# Patient Record
Sex: Male | Born: 1990 | Race: Black or African American | Hispanic: No | Marital: Single | Smoking: Never smoker
Health system: Southern US, Community
[De-identification: ages and names within clinical notes are randomized; demographics above are authoritative.]

## PROBLEM LIST (undated history)

## (undated) DIAGNOSIS — R569 Unspecified convulsions: Secondary | ICD-10-CM

## (undated) HISTORY — PX: FRACTURE SURGERY: SHX138

---

## 2020-12-31 ENCOUNTER — Emergency Department: Payer: Self-pay

## 2020-12-31 ENCOUNTER — Emergency Department
Admission: EM | Admit: 2020-12-31 | Discharge: 2020-12-31 | Disposition: A | Discharge status: Home / Self Care | Payer: Self-pay | Attending: Emergency Medicine | Admitting: Emergency Medicine

## 2020-12-31 ENCOUNTER — Other Ambulatory Visit: Payer: Self-pay

## 2020-12-31 DIAGNOSIS — R519 Headache, unspecified: Secondary | ICD-10-CM | POA: Insufficient documentation

## 2020-12-31 DIAGNOSIS — R569 Unspecified convulsions: Secondary | ICD-10-CM | POA: Insufficient documentation

## 2020-12-31 DIAGNOSIS — G43909 Migraine, unspecified, not intractable, without status migrainosus: Secondary | ICD-10-CM

## 2020-12-31 LAB — CBC
HCT: 46.3 % (ref 39.0–52.0)
Hemoglobin: 14.7 g/dL (ref 13.0–17.0)
MCH: 27.9 pg (ref 26.0–34.0)
MCHC: 31.7 g/dL (ref 30.0–36.0)
MCV: 88 fL (ref 80.0–100.0)
Platelets: 136 10*3/uL — ABNORMAL LOW (ref 150–400)
RBC: 5.26 MIL/uL (ref 4.22–5.81)
RDW: 12.3 % (ref 11.5–15.5)
WBC: 11.1 10*3/uL — ABNORMAL HIGH (ref 4.0–10.5)
nRBC: 0 % (ref 0.0–0.2)

## 2020-12-31 LAB — BASIC METABOLIC PANEL
Anion gap: 7 (ref 5–15)
BUN: 9 mg/dL (ref 6–20)
CO2: 27 mmol/L (ref 22–32)
Calcium: 8.5 mg/dL — ABNORMAL LOW (ref 8.9–10.3)
Chloride: 104 mmol/L (ref 98–111)
Creatinine, Ser: 1 mg/dL (ref 0.61–1.24)
GFR, Estimated: >60 mL/min (ref >60)
Glucose, Bld: 93 mg/dL (ref 70–99)
Potassium: 3.9 mmol/L (ref 3.5–5.1)
Sodium: 138 mmol/L (ref 135–145)

## 2020-12-31 MED ORDER — BUTALBITAL-APAP-CAFFEINE 50-325-40 MG PO TABS: 1.0000 | ORAL_TABLET | Freq: Four times a day (QID) | ORAL | 0 refills | Status: AC | PRN

## 2020-12-31 MED ORDER — METOCLOPRAMIDE HCL 5 MG/ML IJ SOLN
10.0000 mg | Freq: Once | INTRAMUSCULAR | Status: AC
  Administered 2020-12-31: 10 mg via INTRAVENOUS
  Filled 2020-12-31: qty 2

## 2020-12-31 MED ORDER — KETOROLAC TROMETHAMINE 30 MG/ML IJ SOLN
30.0000 mg | Freq: Once | INTRAMUSCULAR | Status: AC
  Administered 2020-12-31: 30 mg via INTRAVENOUS
  Filled 2020-12-31: qty 1

## 2020-12-31 MED ORDER — DIPHENHYDRAMINE HCL 50 MG/ML IJ SOLN
50.0000 mg | Freq: Once | INTRAMUSCULAR | Status: AC
  Administered 2020-12-31: 50 mg via INTRAVENOUS
  Filled 2020-12-31: qty 1

## 2020-12-31 NOTE — ED Triage Notes (Signed)
Pt comes with c/o headache and vomiting. Pt states generalized aches and pain.s

## 2020-12-31 NOTE — ED Provider Notes (Signed)
[facility] Emergency Department Provider Note  Time seen: 1:19 PM  I have reviewed the triage vital signs and the nursing notes.   HISTORY  Chief Complaint Headache   HPI Scott Moon is a 30 y.o. male with a past medical history of seizures per patient presents to the emergency department for headache and seizure.  According to the patient in 2019 he developed a seizure disorder.  States he had work-ups performed however including EEGs that were all normal.  Patient was put on Dilantin but stopped this medication approximately 1 year ago.  Patient states a few days ago he thinks he may have had another seizure but he is not sure remembers feeling weird while on the couch and then waking up.  Patient states over the past few days he has had an intermittent headache and now it has been constant over the past 24 hours.  Denies any nausea or vomiting.  No weakness or numbness.  No fever.  Patient has not followed up with a neurologist in several years.   History reviewed. No pertinent past medical history.  There are no problems to display for this patient.   History reviewed. No pertinent surgical history.  Prior to Admission medications   Not on File    Not on File  No family history on file.  Social History    Review of Systems Constitutional: Negative for fever. Cardiovascular: Negative for chest pain. Respiratory: Negative for shortness of breath. Gastrointestinal: Negative for abdominal pain, vomiting  Musculoskeletal: Negative for musculoskeletal complaints Neurological: Moderate headache All other ROS negative  ____________________________________________   PHYSICAL EXAM:  VITAL SIGNS: ED Triage Vitals  Enc Vitals Group     BP 12/31/20 1205 126/90     Pulse Rate 12/31/20 1205 (!) 58     Resp 12/31/20 1205 17     Temp 12/31/20 1205 98 F (36.7 C)     Temp src --      SpO2 12/31/20 1205 100 %     Weight 12/31/20 1204 150 lb (68  kg)     Height 12/31/20 1204 6' (1.829 m)     Head Circumference --      Peak Flow --      Pain Score 12/31/20 1204 10     Pain Loc --      Pain Edu? --      Excl. in GC? --     Constitutional: Alert and oriented. Well appearing and in no distress. Eyes: Normal exam, no significant photophobia. ENT      Head: Normocephalic and atraumatic.      Mouth/Throat: Mucous membranes are moist. Cardiovascular: Normal rate, regular rhythm. Respiratory: Normal respiratory effort without tachypnea nor retractions. Breath sounds are clear  Gastrointestinal: Soft and nontender. No distention.   Musculoskeletal: Nontender with normal range of motion in all extremities. Neurologic:  Normal speech and language. No gross focal neurologic deficits.  Equal grip strength.  No pronator drift.  Cranial nerves intact.  Skin:  Skin is warm, dry and intact.  Psychiatric: Mood and affect are normal.   ____________________________________________   RADIOLOGY  CT scan is negative for acute abnormality  ____________________________________________   INITIAL IMPRESSION / ASSESSMENT AND PLAN / ED COURSE  Pertinent labs & imaging results that were available during my care of the patient were reviewed by me and considered in my medical decision making (see chart for details).   Patient presents emergency department for headache intermittent over the past week but  ongoing over the past 24 hours.  Also states he was diagnosed with seizures several years ago stopped taking his Dilantin greater than 1 year ago and thinks the other day he may have had a seizure but he is not sure.  Patient states when he had his seizure work-up everything was normal he was told including his EEG.  Currently the patient appears well, normal neurological exam.  Reassuring physical exam.  Given the patient's reported headache however we will dose Toradol Reglan Benadryl.  Given the patient's seizures he states he has never had brain  imaging CT or MRI will obtain a CT scan as a precaution.  The patient's work-up is within normal limits anticipate likely referral to neurology for further evaluation and consideration of antiepileptics.  Discussed with the patient not driving.  CT scans negative for acute abnormality.  Lab work is overall reassuring.  We will refer to neurology.  I did discuss not driving or swimming until he has been cleared by neurology.  We will prescribe Fioricet if needed for headaches in the future.  Patient states significant reduction in headache currently.  Scott Moon was evaluated in Emergency Department on 12/31/2020 for the symptoms described in the history of present illness. He was evaluated in the context of the global COVID-19 pandemic, which necessitated consideration that the patient might be at risk for infection with the SARS-CoV-2 virus that causes COVID-19. Institutional protocols and algorithms that pertain to the evaluation of patients at risk for COVID-19 are in a state of rapid change based on information released by regulatory bodies including the CDC and federal and state organizations. These policies and algorithms were followed during the patient's care in the ED.  ____________________________________________   FINAL CLINICAL IMPRESSION(S) / ED DIAGNOSES  Headache Seizure-like activity   Minna Antis, MD 12/31/20 1534

## 2021-01-21 ENCOUNTER — Other Ambulatory Visit: Payer: Self-pay

## 2021-01-21 ENCOUNTER — Emergency Department: Payer: Self-pay

## 2021-01-21 ENCOUNTER — Emergency Department
Admission: EM | Admit: 2021-01-21 | Discharge: 2021-01-21 | Disposition: A | Discharge status: Home / Self Care | Payer: Self-pay | Attending: Emergency Medicine | Admitting: Emergency Medicine

## 2021-01-21 DIAGNOSIS — Y92410 Unspecified street and highway as the place of occurrence of the external cause: Secondary | ICD-10-CM | POA: Insufficient documentation

## 2021-01-21 DIAGNOSIS — G40909 Epilepsy, unspecified, not intractable, without status epilepticus: Secondary | ICD-10-CM | POA: Insufficient documentation

## 2021-01-21 DIAGNOSIS — R569 Unspecified convulsions: Secondary | ICD-10-CM

## 2021-01-21 HISTORY — DX: Unspecified convulsions: R56.9

## 2021-01-21 LAB — CBC
HCT: 38.9 % — ABNORMAL LOW (ref 39.0–52.0)
Hemoglobin: 12.5 g/dL — ABNORMAL LOW (ref 13.0–17.0)
MCH: 28.3 pg (ref 26.0–34.0)
MCHC: 32.1 g/dL (ref 30.0–36.0)
MCV: 88.2 fL (ref 80.0–100.0)
Platelets: 159 10*3/uL (ref 150–400)
RBC: 4.41 MIL/uL (ref 4.22–5.81)
RDW: 12.3 % (ref 11.5–15.5)
WBC: 8.1 10*3/uL (ref 4.0–10.5)
nRBC: 0 % (ref 0.0–0.2)

## 2021-01-21 LAB — BASIC METABOLIC PANEL
Anion gap: 7 (ref 5–15)
BUN: 11 mg/dL (ref 6–20)
CO2: 26 mmol/L (ref 22–32)
Calcium: 8.8 mg/dL — ABNORMAL LOW (ref 8.9–10.3)
Chloride: 106 mmol/L (ref 98–111)
Creatinine, Ser: 0.84 mg/dL (ref 0.61–1.24)
GFR, Estimated: >60 mL/min (ref >60)
Glucose, Bld: 138 mg/dL — ABNORMAL HIGH (ref 70–99)
Potassium: 3.7 mmol/L (ref 3.5–5.1)
Sodium: 139 mmol/L (ref 135–145)

## 2021-01-21 LAB — URINE DRUG SCREEN, QUALITATIVE (ARMC ONLY)
Amphetamines, Ur Screen: NOT DETECTED
Barbiturates, Ur Screen: NOT DETECTED
Benzodiazepine, Ur Scrn: NOT DETECTED
Cannabinoid 50 Ng, Ur ~~LOC~~: POSITIVE — AB
Cocaine Metabolite,Ur ~~LOC~~: NOT DETECTED
MDMA (Ecstasy)Ur Screen: NOT DETECTED
Methadone Scn, Ur: NOT DETECTED
Opiate, Ur Screen: NOT DETECTED
Phencyclidine (PCP) Ur S: NOT DETECTED
Tricyclic, Ur Screen: NOT DETECTED

## 2021-01-21 LAB — URINALYSIS, COMPLETE (UACMP) WITH MICROSCOPIC
Bacteria, UA: NONE SEEN
Bilirubin Urine: NEGATIVE
Glucose, UA: NEGATIVE mg/dL
Hgb urine dipstick: NEGATIVE
Ketones, ur: NEGATIVE mg/dL
Nitrite: NEGATIVE
Protein, ur: NEGATIVE mg/dL
Specific Gravity, Urine: 1.015 (ref 1.005–1.030)
pH: 8 (ref 5.0–8.0)

## 2021-01-21 LAB — CBG MONITORING, ED: Glucose-Capillary: 81 mg/dL (ref 70–99)

## 2021-01-21 LAB — PHENYTOIN LEVEL, TOTAL: Phenytoin Lvl: <2.5 ug/mL — ABNORMAL LOW (ref 10.0–20.0)

## 2021-01-21 MED ORDER — PHENYTOIN SODIUM EXTENDED 300 MG PO CAPS: 300.0000 mg | ORAL_CAPSULE | Freq: Every day | ORAL | 3 refills | Status: AC

## 2021-01-21 MED ORDER — PHENYTOIN SODIUM EXTENDED 100 MG PO CAPS
300.0000 mg | ORAL_CAPSULE | Freq: Once | ORAL | Status: AC
  Administered 2021-01-21: 300 mg via ORAL
  Filled 2021-01-21: qty 3
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
## 2021-01-21 NOTE — ED Notes (Signed)
Pt ambulatory independently with a steady gait to restroom and back to room.

## 2021-01-21 NOTE — Discharge Instructions (Addendum)
Follow-up with neurology.  Please call for appointment.  Phone number is attached to papers.  Take your Dilantin daily.  If you continue to have additional seizures she may return the emergency department.  No driving until you have been evaluated by neurology.
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
## 2021-01-21 NOTE — ED Notes (Signed)
Patient transported to CT 

## 2021-01-22 LAB — PHENYTOIN LEVEL, FREE AND TOTAL
Phenytoin, Free: NOT DETECTED ug/mL (ref 1.0–2.0)
Phenytoin, Total: <0.8 ug/mL — ABNORMAL LOW (ref 10.0–20.0)

## 2021-08-29 IMAGING — CT CT HEAD W/O CM
3 series · 16 of 47 positions shown, 19 images · non-contrast
Comparison: None.

CLINICAL DATA: Seizure, abnormal neuro exam.

EXAM:
CT HEAD WITHOUT CONTRAST
TECHNIQUE: Contiguous axial images were obtained from the base of the skull
through the vertex without intravenous contrast.

[Series 2: head wo · axial · 0.42mm/px · z∈[-180,-55]mm · 10 of 30 slices shown, 13 images]
[im 3/30  brain]
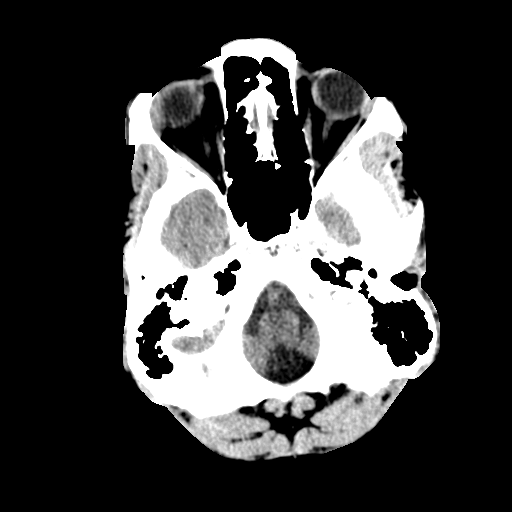
[im 3/30  bone]
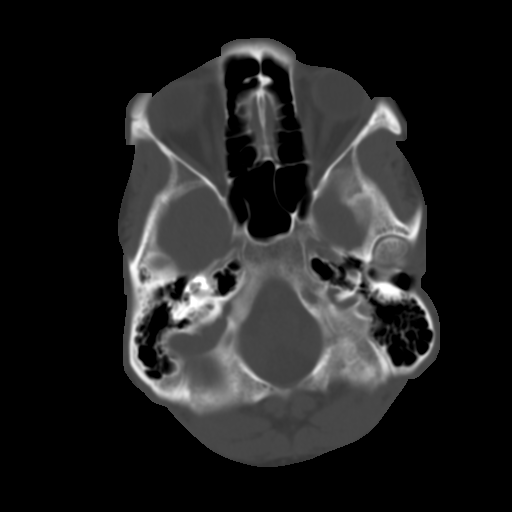
[im 6/30  brain]
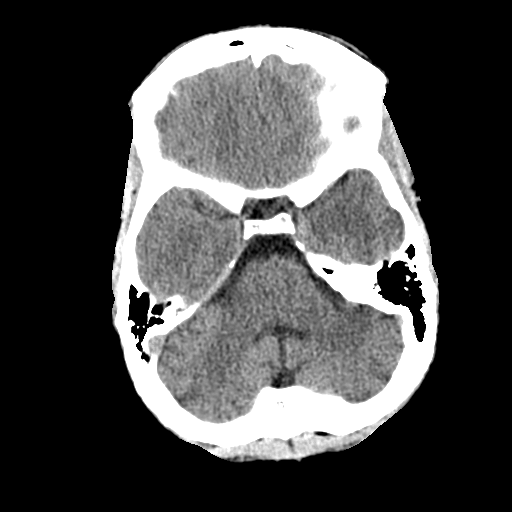
[im 9/30  brain]
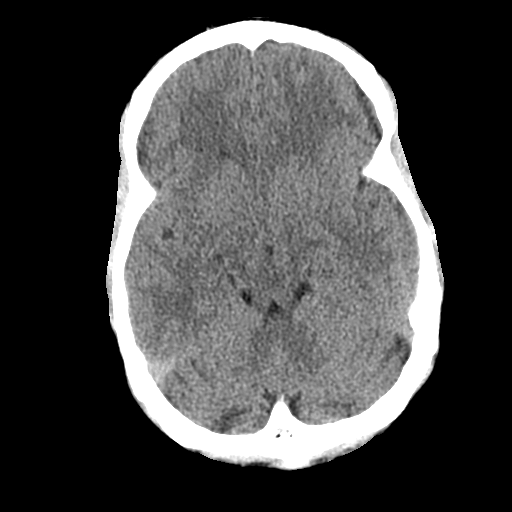
[im 11/30  brain]
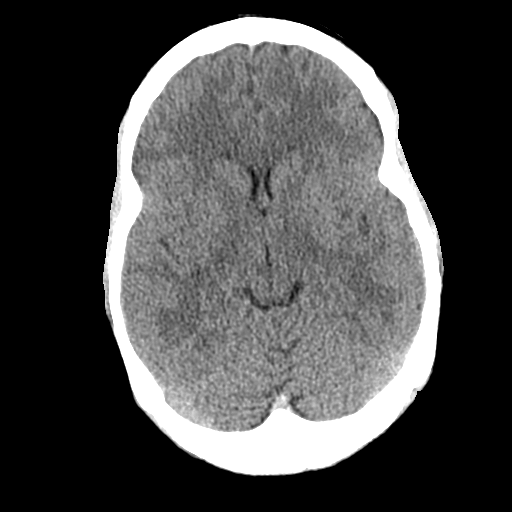
[im 14/30  brain]
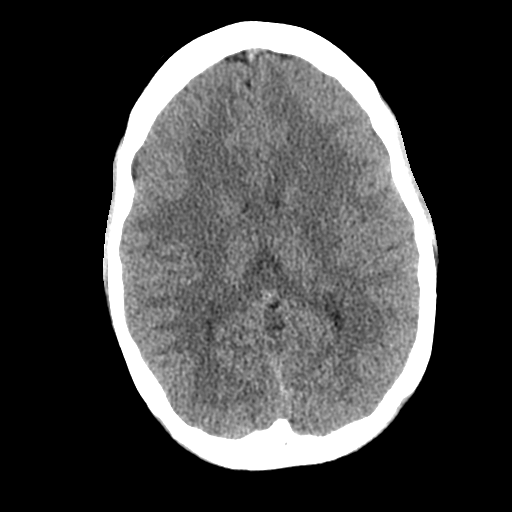
[im 14/30  bone]
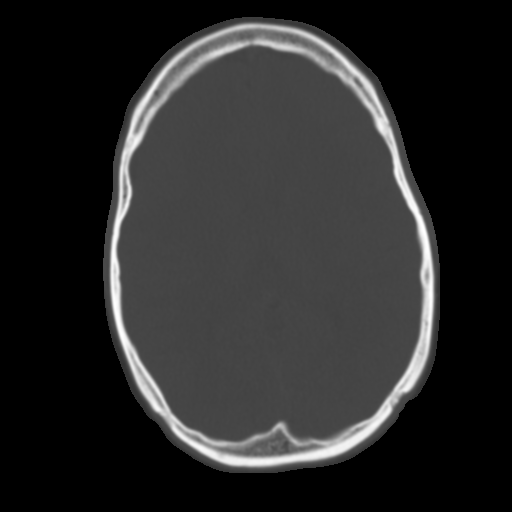
[im 17/30  brain]
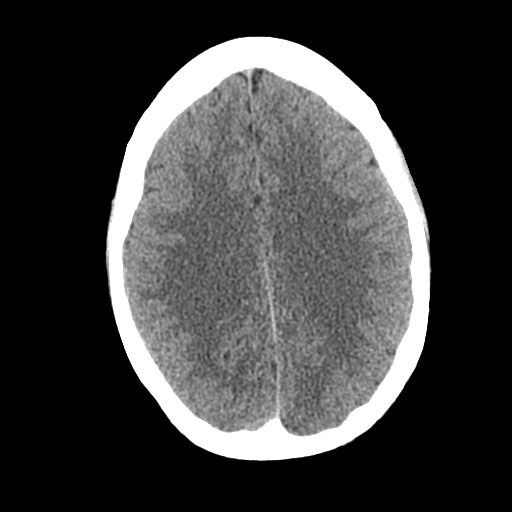
[im 20/30  brain]
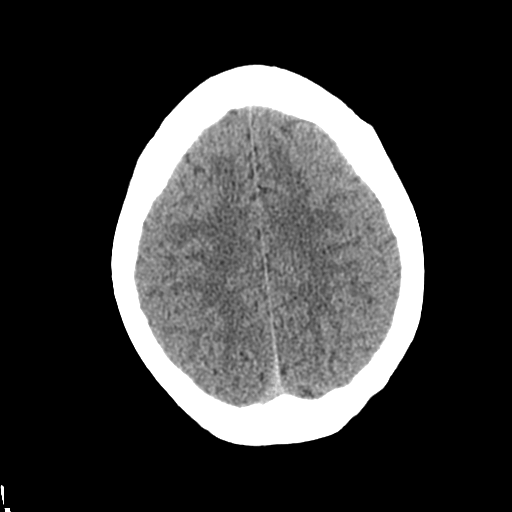
[im 23/30  brain]
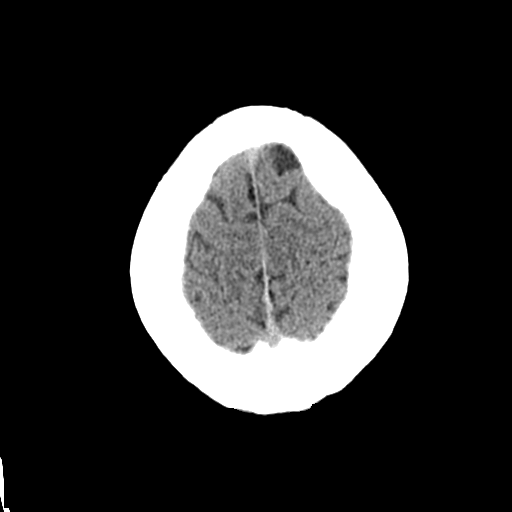
[im 25/30  brain]
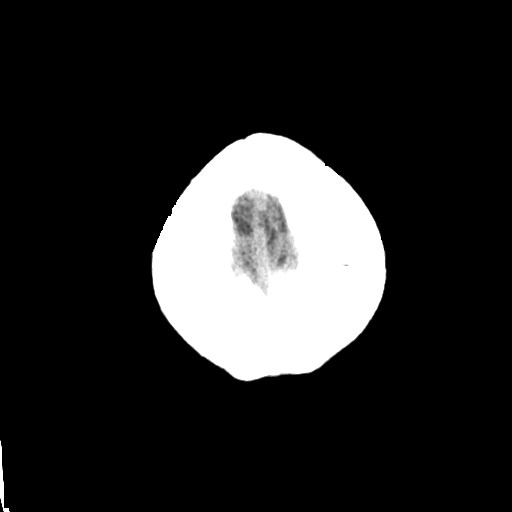
[im 25/30  bone]
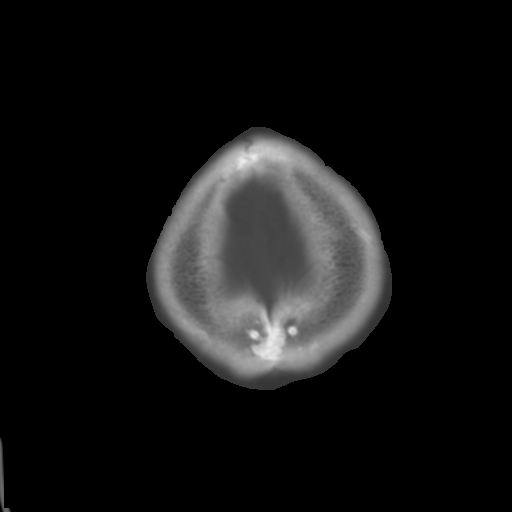
[im 28/30  brain]
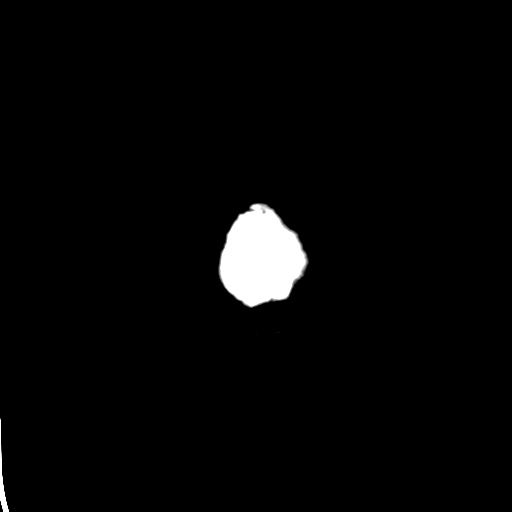

[Series 4: coronal soft tissue · coronal · 0.30mm/px · 3 of 69 slices shown]
[im 23/69  brain]
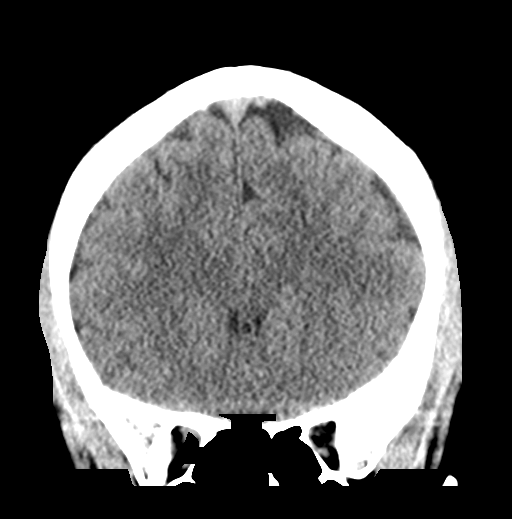
[im 31/69  brain]
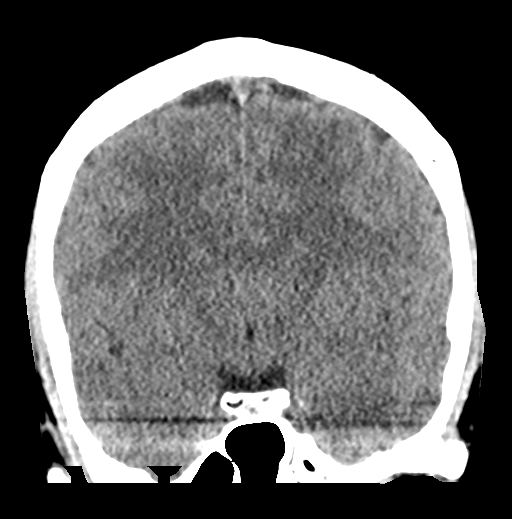
[im 38/69  brain]
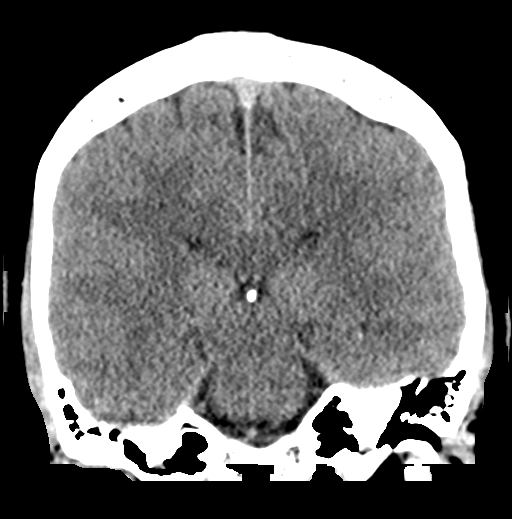

[Series 5: sagittal soft tissue · sagittal · 0.31mm/px · 3 of 53 slices shown]
[im 18/53  brain]
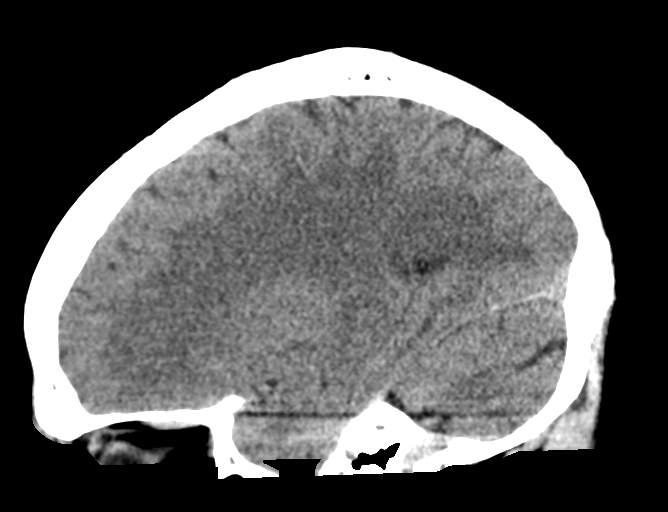
[im 27/53  brain]
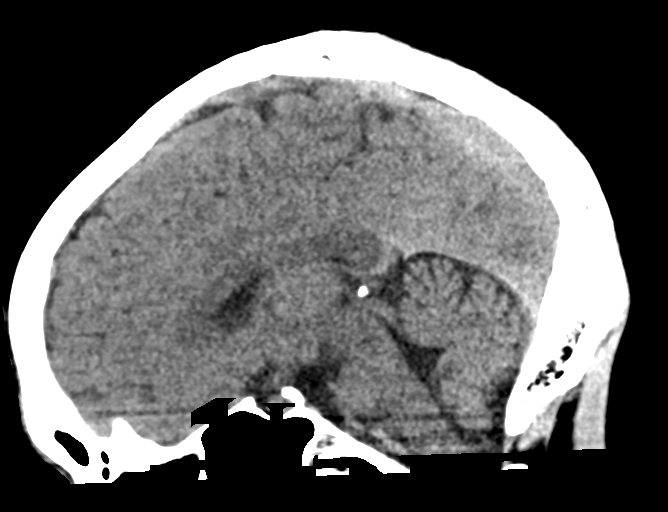
[im 35/53  brain]
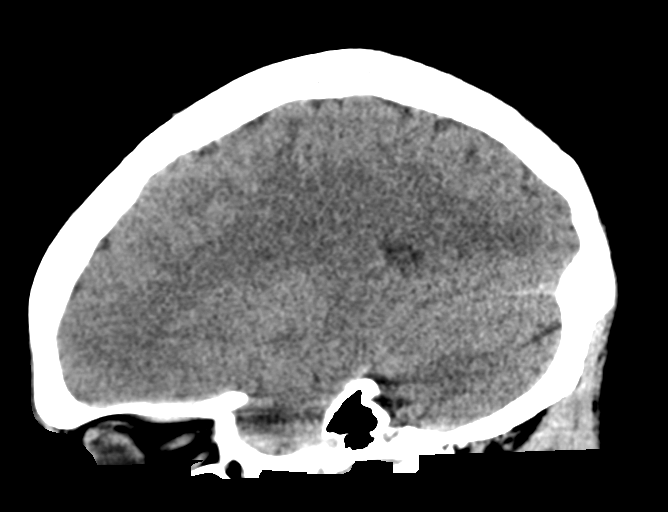

[16 of 47 positions shown; findings below may reference images not displayed]

FINDINGS: Brain: No evidence of acute large vascular territory infarction,
hemorrhage, hydrocephalus, extra-axial collection or mass
lesion/mass effect.

Vascular: No hyperdense vessel identified.

Skull: No acute fracture.

Sinuses/Orbits: Clear visualized sinuses. Unremarkable visualized
orbits.

Other: No visible mastoid effusions.
IMPRESSION: No evidence of acute intracranial abnormality. If the patient's
seizures continue recommend epilepsy protocol MRI to provide more
sensitive evaluation for an anatomic epileptogenic abnormality.

## 2022-06-18 ENCOUNTER — Other Ambulatory Visit: Payer: Self-pay

## 2022-06-18 ENCOUNTER — Encounter: Payer: Self-pay | Admitting: Emergency Medicine

## 2022-06-18 DIAGNOSIS — R42 Dizziness and giddiness: Secondary | ICD-10-CM | POA: Insufficient documentation

## 2022-06-18 LAB — BASIC METABOLIC PANEL
Anion gap: 6 (ref 5–15)
BUN: 10 mg/dL (ref 6–20)
CO2: 28 mmol/L (ref 22–32)
Calcium: 9.3 mg/dL (ref 8.9–10.3)
Chloride: 103 mmol/L (ref 98–111)
Creatinine, Ser: 0.79 mg/dL (ref 0.61–1.24)
GFR, Estimated: >60 mL/min (ref >60)
Glucose, Bld: 108 mg/dL — ABNORMAL HIGH (ref 70–99)
Potassium: 3.6 mmol/L (ref 3.5–5.1)
Sodium: 137 mmol/L (ref 135–145)

## 2022-06-18 LAB — CBC
HCT: 42.6 % (ref 39.0–52.0)
Hemoglobin: 13.4 g/dL (ref 13.0–17.0)
MCH: 27.4 pg (ref 26.0–34.0)
MCHC: 31.5 g/dL (ref 30.0–36.0)
MCV: 87.1 fL (ref 80.0–100.0)
Platelets: 155 10*3/uL (ref 150–400)
RBC: 4.89 MIL/uL (ref 4.22–5.81)
RDW: 11.9 % (ref 11.5–15.5)
WBC: 8.5 10*3/uL (ref 4.0–10.5)
nRBC: 0 % (ref 0.0–0.2)

## 2022-06-18 LAB — TROPONIN I (HIGH SENSITIVITY): Troponin I (High Sensitivity): 4 ng/L (ref <18)

## 2022-06-18 LAB — PHENYTOIN LEVEL, TOTAL: Phenytoin Lvl: 3.2 ug/mL — ABNORMAL LOW (ref 10.0–20.0)

## 2022-06-18 NOTE — ED Triage Notes (Signed)
Pt arrived via POV reports dizziness since Tuesday while at work, pt states the dizziness is worse with standing.  Pt denies any pain at this time. Pt states he take dilantin reports he recently got a new RX and has missed some doses.   Denies any recent seizures.

## 2022-06-19 ENCOUNTER — Emergency Department
Admission: EM | Admit: 2022-06-19 | Discharge: 2022-06-19 | Disposition: A | Discharge status: Home / Self Care | Payer: Self-pay | Attending: Emergency Medicine | Admitting: Emergency Medicine

## 2022-06-19 ENCOUNTER — Emergency Department: Payer: Self-pay

## 2022-06-19 DIAGNOSIS — R42 Dizziness and giddiness: Secondary | ICD-10-CM

## 2022-06-19 MED ORDER — SODIUM CHLORIDE 0.9 % IV BOLUS
1000.0000 mL | Freq: Once | INTRAVENOUS | Status: AC
  Administered 2022-06-19: 1000 mL via INTRAVENOUS

## 2022-06-19 MED ORDER — PHENYTOIN SODIUM EXTENDED 100 MG PO CAPS
300.0000 mg | ORAL_CAPSULE | Freq: Once | ORAL | Status: AC
  Administered 2022-06-19: 300 mg via ORAL
  Filled 2022-06-19: qty 3

## 2022-06-19 NOTE — Discharge Instructions (Signed)
Drink plenty of fluids daily.  Return to the ER for worsening symptoms, persistent vomiting, lethargy or other concerns. 

## 2022-06-19 NOTE — ED Provider Notes (Signed)
[facility] Provider Note    Event Date/Time   First MD Initiated Contact with Patient 06/19/22 0014     (approximate)   History   Dizziness   HPI  Scott Moon is a 31 y.o. male who presents to the ED from home with a chief complaint of dizziness.  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  Reports sensation of room spinning, worsened with standing.  Symptoms x3 days which began while at work.  Denies vision changes, headache, neck pain, chest pain, shortness of breath, abdominal pain, nausea or vomiting.  Denies sinus congestion/pressure.  History of seizures and reports last took Dilantin yesterday, none today.     Past Medical History   Past Medical History:  Diagnosis Date   Seizures (HCC)      Active Problem List  There are no problems to display for this patient.    Past Surgical History  History reviewed. No pertinent surgical history.   Home Medications   Prior to Admission medications   Medication Sig Start Date End Date Taking? Authorizing Provider  phenytoin (DILANTIN) 300 MG ER capsule Take 1 capsule (300 mg total) by mouth daily. 01/21/21   Faythe Ghee, PA-C     Allergies  Patient has no known allergies.   Family History  No family history on file.   Physical Exam  Triage Vital Signs: ED Triage Vitals [06/18/22 2034]  Enc Vitals Group     BP (!) 133/91     Pulse Rate 62     Resp 16     Temp 98.1 F (36.7 C)     Temp Source Oral     SpO2 99 %     Weight 147 lb (66.7 kg)     Height 6' (1.829 m)     Head Circumference      Peak Flow      Pain Score 0     Pain Loc      Pain Edu?      Excl. in GC?     Updated Vital Signs: BP (!) 133/92   Pulse 65   Temp 98 F (36.7 C) (Oral)   Resp 16   Ht 6' (1.829 m)   Wt 66.7 kg   SpO2 99%   BMI 19.94 kg/m    General: Awake, no distress.  CV:  Good peripheral perfusion.  Resp:  Normal effort.  Abd:  No distention.  Other:     ED  Results / Procedures / Treatments  Labs (all labs ordered are listed, but only abnormal results are displayed) Labs Reviewed  BASIC METABOLIC PANEL - Abnormal; Notable for the following components:      Result Value   Glucose, Bld 108 (*)    All other components within normal limits  PHENYTOIN LEVEL, TOTAL - Abnormal; Notable for the following components:   Phenytoin Lvl 3.2 (*)    All other components within normal limits  CBC  URINALYSIS, ROUTINE W REFLEX MICROSCOPIC  TROPONIN I (HIGH SENSITIVITY)     EKG  ED ECG REPORT I, Adebayo Ensminger J, the attending physician, personally viewed and interpreted this ECG.   Date: 06/19/2022  EKG Time: 2038  Rate: 50  Rhythm: sinus bradycardia  Axis: Normal  Intervals:none  ST&T Change: Nonspecific    RADIOLOGY I have independently visualized and interpreted patient's CT head as well as noted the radiology interpretation:  CT head: No ICH  Official radiology report(s): CT Head Wo Contrast  Result Date: 06/19/2022 CLINICAL DATA:  Dizziness. EXAM: CT HEAD WITHOUT CONTRAST TECHNIQUE: Contiguous axial images were obtained from the base of the skull through the vertex without intravenous contrast. RADIATION DOSE REDUCTION: This exam was performed according to the departmental dose-optimization program which includes automated exposure control, adjustment of the mA and/or kV according to patient size and/or use of iterative reconstruction technique. COMPARISON:  Jan 21, 2021 FINDINGS: Brain: No evidence of acute infarction, hemorrhage, hydrocephalus, extra-axial collection or mass lesion/mass effect. Vascular: No hyperdense vessel or unexpected calcification. Skull: Normal. Negative for fracture or focal lesion. Sinuses/Orbits: No acute finding. Other: None. IMPRESSION: No acute intracranial pathology. Electronically Signed   By: Virgina Norfolk M.D.   On: 06/19/2022 00:49     PROCEDURES:  Critical Care performed:  No  Procedures   MEDICATIONS ORDERED IN ED: Medications  sodium chloride 0.9 % bolus 1,000 mL (0 mLs Intravenous Stopped 06/19/22 0154)  phenytoin (DILANTIN) ER capsule 300 mg (300 mg Oral Given 06/19/22 0052)     IMPRESSION / MDM / ASSESSMENT AND PLAN / ED COURSE  I reviewed the triage vital signs and the nursing notes.                             31 year old male presenting with dizziness.  Differential diagnosis includes but is not limited to orthostasis, dehydration, ACS, metabolic, infectious etiologies, etc.  I have personally reviewed patient's records and see a last ED visit in May 2022 for seizure.  Patient's presentation is most consistent with acute presentation with potential threat to life or bodily function.  Laboratory results demonstrate normal WBC 8.5, normal electrolytes and troponin, subtherapeutic phenytoin level.  Will obtain CT head, orthostatic vital signs, initiate IV fluid resuscitation, load Dilantin orally and reassess.  Clinical Course as of 06/19/22 3664  Ludwig Clarks Jun 19, 2022  0134 Patient orthostatic by heart rate [JS]  0211 Patient feeling better after IV fluids.  Updated patient and his godmother of all results including the unremarkable CT head.  No focal neurological deficits on examination.  Neck remains supple without meningismus.  Feel patient is safe for discharge home with PCP follow-up.  Strict return precautions given.  Both verbalized understanding agree with plan of care. [JS]    Clinical Course User Index [JS] Paulette Blanch, MD     FINAL CLINICAL IMPRESSION(S) / ED DIAGNOSES   Final diagnoses:  Dizziness  Orthostatic dizziness     Rx / DC Orders   ED Discharge Orders     None        Note:  This document was prepared using Dragon voice recognition software and may include unintentional dictation errors.   Paulette Blanch, MD 06/19/22 [phone]

## 2022-06-23 ENCOUNTER — Ambulatory Visit
Admission: RE | Admit: 2022-06-23 | Discharge: 2022-06-23 | Disposition: A | Discharge status: Home / Self Care | Payer: 59 | Source: Ambulatory Visit | Attending: Family Medicine | Admitting: Family Medicine

## 2022-06-23 VITALS — BP 137/81 | HR 52 | Temp 98.5°F | Resp 16 | Ht 72.0 in | Wt 147.0 lb

## 2022-06-23 DIAGNOSIS — Z202 Contact with and (suspected) exposure to infections with a predominantly sexual mode of transmission: Secondary | ICD-10-CM | POA: Diagnosis not present

## 2022-06-23 DIAGNOSIS — Z113 Encounter for screening for infections with a predominantly sexual mode of transmission: Secondary | ICD-10-CM | POA: Diagnosis not present

## 2022-06-23 MED ORDER — METRONIDAZOLE 500 MG PO TABS: 2000.0000 mg | ORAL_TABLET | Freq: Once | ORAL | 0 refills | Status: AC

## 2022-06-23 NOTE — Discharge Instructions (Signed)
Stop by the pharmacy to pick up your prescriptions.  Follow up with your primary care provider as needed.  

## 2022-06-23 NOTE — ED Provider Notes (Signed)
[facility]    CSN: [id number] Arrival date & time: 06/23/22  1020      History   Chief Complaint Chief Complaint  Patient presents with   Exposure to STD    HPI  HPI  Slayden Mennenga is a 31 y.o. male.   Pt here for STD screening.  States that his current girlfriend told him that she has trichomonas after being checked at her doctor's office.   Reports no  symptoms.    Ramirez does not use condoms regularly.   - Penile discharge no -Testicular pain no  - Fever: no - Abdominal pain no - Rash: no - Sore throat: no   - Arthralgias: no - Nausea: no - Vomiting: no - Dysuria: no - Back Pain: no  - Headache: no       Past Medical History:  Diagnosis Date   Seizures (HCC)     There are no problems to display for this patient.   Past Surgical History:  Procedure Laterality Date   FRACTURE SURGERY Right        Home Medications    Prior to Admission medications   Medication Sig Start Date End Date Taking? Authorizing Provider  metroNIDAZOLE (FLAGYL) 500 MG tablet Take 4 tablets (2,000 mg total) by mouth once for 1 dose. 06/23/22 06/23/22 Yes Lonza Shimabukuro, DO  phenytoin (DILANTIN) 300 MG ER capsule Take 1 capsule (300 mg total) by mouth daily. 01/21/21  Yes Faythe Ghee, PA-C    Family History No family history on file.  Social History Social History   Tobacco Use   Smoking status: Never   Smokeless tobacco: Never  Vaping Use   Vaping Use: Never used  Substance Use Topics   Alcohol use: Not Currently   Drug use: Yes    Types: Marijuana     Allergies   Patient has no known allergies.   Review of Systems Review of Systems: negative unless otherwise stated in HPI.      Physical Exam Triage Vital Signs ED Triage Vitals  Enc Vitals Group     BP 06/23/22 1142 137/81     Pulse Rate 06/23/22 1142 (!) 52     Resp 06/23/22 1142 16     Temp 06/23/22 1142 98.5 F (36.9 C)     Temp Source 06/23/22 1142 Oral     SpO2  06/23/22 1142 96 %     Weight 06/23/22 1141 147 lb 0.8 oz (66.7 kg)     Height 06/23/22 1141 6' (1.829 m)     Head Circumference --      Peak Flow --      Pain Score --      Pain Loc --      Pain Edu? --      Excl. in GC? --    No data found.  Updated Vital Signs BP 137/81 (BP Location: Right Arm)   Pulse (!) 52   Temp 98.5 F (36.9 C) (Oral)   Resp 16   Ht 6' (1.829 m)   Wt 66.7 kg   SpO2 96%   BMI 19.94 kg/m   Visual Acuity Right Eye Distance:   Left Eye Distance:   Bilateral Distance:    Right Eye Near:   Left Eye Near:    Bilateral Near:     Physical Exam GEN: well appearing male in no acute distress  CVS: well perfused  RESP: speaking in full sentences without pause, no respiratory distress  GU: deferred, patient  performed self swab    UC Treatments / Results  Labs (all labs ordered are listed, but only abnormal results are displayed) Labs Reviewed  CYTOLOGY, (ORAL, ANAL, URETHRAL) ANCILLARY ONLY    EKG   Radiology No results found.  Procedures Procedures (including critical care time)  Medications Ordered in UC Medications - No data to display  Initial Impression / Assessment and Plan / UC Course  I have reviewed the triage vital signs and the nursing notes.  Pertinent labs & imaging results that were available during my care of the patient were reviewed by me and considered in my medical decision making (see chart for details).       STI screening  Trichomonas exposure  Patient is a 31 year old male who was exposed to trichomonas.  Advised STI testing.  GC and chlamydia DNA  probe sent to lab. HIV and RPR declined.  Says he gets blood often and they likely will test him there.  Again recommended HIV and syphilis testing and he again declined.  Recommended trichomonas treatment treat with 2 g of metronidazole.  Advised not to drink alcohol with taking this medication.  Recommend he use condoms regularly.  Return precautions including  abdominal pain, fever, chills, nausea, or vomiting given.    Final Clinical Impressions(s) / UC Diagnoses   Final diagnoses:  STD exposure  Encounter for screening examination for sexually transmitted disease     Discharge Instructions      Stop by the pharmacy to pick up your prescriptions.  Follow up with your primary care provider as needed.     ED Prescriptions     Medication Sig Dispense Auth. Provider   metroNIDAZOLE (FLAGYL) 500 MG tablet Take 4 tablets (2,000 mg total) by mouth once for 1 dose. 4 tablet Lyndee Hensen, DO      PDMP not reviewed this encounter.   Lyndee Hensen, DO 06/23/22 1201

## 2022-06-23 NOTE — ED Triage Notes (Signed)
Pt states his girlfriend told him she had trichomonas. He is not having symptoms.

## 2022-06-24 LAB — CYTOLOGY, (ORAL, ANAL, URETHRAL) ANCILLARY ONLY
Chlamydia: NEGATIVE
Comment: NEGATIVE
Comment: NEGATIVE
Comment: NORMAL
Neisseria Gonorrhea: NEGATIVE
Trichomonas: POSITIVE — AB
